# Patient Record
Sex: Female | Born: 2000 | Race: White | Hispanic: Yes | Marital: Single | Smoking: Never smoker
Health system: Southern US, Community
[De-identification: ages and names within clinical notes are randomized; demographics above are authoritative.]

## PROBLEM LIST (undated history)

## (undated) DIAGNOSIS — F4310 Post-traumatic stress disorder, unspecified: Secondary | ICD-10-CM

## (undated) DIAGNOSIS — F319 Bipolar disorder, unspecified: Secondary | ICD-10-CM

## (undated) DIAGNOSIS — F509 Eating disorder, unspecified: Secondary | ICD-10-CM

## (undated) HISTORY — PX: CHOLECYSTECTOMY: SHX55

## (undated) HISTORY — DX: Post-traumatic stress disorder, unspecified: F43.10

## (undated) HISTORY — DX: Bipolar disorder, unspecified: F31.9

## (undated) HISTORY — PX: APPENDECTOMY: SHX54

---
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
## 2020-10-01 DIAGNOSIS — E559 Vitamin D deficiency, unspecified: Secondary | ICD-10-CM | POA: Insufficient documentation

## 2020-11-10 ENCOUNTER — Other Ambulatory Visit: Payer: Self-pay

## 2020-11-10 ENCOUNTER — Ambulatory Visit (HOSPITAL_COMMUNITY)
Admission: EM | Admit: 2020-11-10 | Discharge: 2020-11-10 | Disposition: A | Discharge status: Home / Self Care | Payer: Medicaid Other | Attending: Internal Medicine | Admitting: Internal Medicine

## 2020-11-10 ENCOUNTER — Encounter (HOSPITAL_COMMUNITY): Payer: Self-pay | Admitting: *Deleted

## 2020-11-10 DIAGNOSIS — N390 Urinary tract infection, site not specified: Secondary | ICD-10-CM | POA: Insufficient documentation

## 2020-11-10 LAB — POCT URINALYSIS DIPSTICK, ED / UC
Glucose, UA: 250 mg/dL — AB
Ketones, ur: 15 mg/dL — AB
Nitrite: POSITIVE — AB
Protein, ur: >=300 mg/dL — AB
Specific Gravity, Urine: 1.02 (ref 1.005–1.030)
Urobilinogen, UA: >=8 mg/dL (ref 0.0–1.0)
pH: 5 (ref 5.0–8.0)

## 2020-11-10 LAB — CBG MONITORING, ED: Glucose-Capillary: 69 mg/dL — ABNORMAL LOW (ref 70–99)

## 2020-11-10 LAB — POC URINE PREG, ED: Preg Test, Ur: NEGATIVE

## 2020-11-10 MED ORDER — CEPHALEXIN 500 MG PO CAPS: 500.0000 mg | ORAL_CAPSULE | Freq: Two times a day (BID) | ORAL | 0 refills | Status: DC

## 2020-11-10 NOTE — ED Triage Notes (Signed)
Pt reports frequency ,hematuria for 2 days

## 2020-11-10 NOTE — ED Provider Notes (Signed)
[facility]    CSN: [id number] Arrival date & time: 11/10/20  1240      History   Chief Complaint Chief Complaint  Patient presents with  . Urinary Tract Infection  . Urinary Frequency  . Hematuria    HPI Autumn Rodriguez is a 20 y.o. female.   Here today with 2-day history of dysuria, hematuria, urinary frequency.  Some lower abdominal pressure starting this morning and noticed symptoms seem to be worsening.  Denies fever, chills, nausea, vomiting, diarrhea, vaginal symptoms.  So far taking Azo with minimal temporary relief.  Does have a history of urinary tract infections which have felt similar.     Past Medical History:  Diagnosis Date  . Eating disorder    purging and restricting calories    Patient Active Problem List   Diagnosis Date Noted  . 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 Generalized anxiety disorder 04/09/2014    History reviewed. No pertinent surgical history.  OB History   No obstetric history on file.      Home Medications    Prior to Admission medications   Medication Sig Start Date End Date Taking? Authorizing Provider  cephALEXin (KEFLEX) 500 MG capsule Take 1 capsule (500 mg total) by mouth 2 (two) times daily. 11/10/20  Yes Particia Nearing, PA-C  cetirizine (ZYRTEC) 10 MG tablet Take 1 tablet (10 mg total) by mouth at bedtime. 04/14/14   Withrow, Everardo All, FNP  hydrOXYzine (ATARAX/VISTARIL) 25 MG tablet Take 25 mg by mouth 3 (three) times daily as needed.    [provider]  mometasone (NASONEX) 50 MCG/ACT nasal spray Place 2 sprays into the nose daily. 04/14/14   Withrow, Everardo All, FNP  sodium chloride (OCEAN) 0.65 % SOLN nasal spray Place 1 spray into both nostrils as needed for congestion.    [provider]  vitamin B-6 (PYRIDOXINE) 25 MG tablet Take 1 tablet (25 mg total) by mouth every 8 (eight) hours as needed (for nausea and/or vomiting). 10/23/18    Rennis Harding, PA-C    Family History History reviewed. No pertinent family history.  Social History Social History   Tobacco Use  . Smoking status: Never Smoker  . Smokeless tobacco: Never Used  Substance Use Topics  . Alcohol use: No  . Drug use: No     Allergies   Other  Review of Systems Review of Systems Per HPI  Physical Exam Triage Vital Signs ED Triage Vitals  Enc Vitals Group     BP 11/10/20 1343 115/72     Pulse Rate 11/10/20 1343 100     Resp 11/10/20 1343 16     Temp 11/10/20 1343 98.2 F (36.8 C)     Temp src --      SpO2 11/10/20 1343 98 %     Weight --      Height --      Head Circumference --      Peak Flow --      Pain Score 11/10/20 1341 8     Pain Loc --      Pain Edu? --      Excl. in GC? --    No data found.  Updated Vital Signs BP 115/72   Pulse 100   Temp 98.2 F (36.8 C)   Resp 16   LMP 11/05/2020   SpO2 98%   Visual Acuity Right Eye Distance:   Left Eye Distance:   Bilateral Distance:  Right Eye Near:   Left Eye Near:    Bilateral Near:     Physical Exam Vitals and nursing note reviewed.  Constitutional:      Appearance: Normal appearance. She is not ill-appearing.  HENT:     Head: Atraumatic.  Eyes:     Extraocular Movements: Extraocular movements intact.     Conjunctiva/sclera: Conjunctivae normal.  Cardiovascular:     Rate and Rhythm: Normal rate and regular rhythm.     Heart sounds: Normal heart sounds.  Pulmonary:     Effort: Pulmonary effort is normal.     Breath sounds: Normal breath sounds.  Abdominal:     General: Bowel sounds are normal. There is no distension.     Palpations: Abdomen is soft.     Tenderness: There is no abdominal tenderness. There is no right CVA tenderness, left CVA tenderness or guarding.  Musculoskeletal:        General: Normal range of motion.     Cervical back: Normal range of motion and neck supple.  Skin:    General: Skin is warm and dry.  Neurological:     Mental  Status: She is alert and oriented to person, place, and time.  Psychiatric:        Mood and Affect: Mood normal.        Thought Content: Thought content normal.        Judgment: Judgment normal.    UC Treatments / Results  Labs (all labs ordered are listed, but only abnormal results are displayed) Labs Reviewed  POCT URINALYSIS DIPSTICK, ED / UC - Abnormal; Notable for the following components:      Result Value   Glucose, UA 250 (*)    Bilirubin Urine MODERATE (*)    Ketones, ur 15 (*)    Hgb urine dipstick LARGE (*)    Protein, ur >=300 (*)    Nitrite POSITIVE (*)    Leukocytes,Ua LARGE (*)    All other components within normal limits  CBG MONITORING, ED - Abnormal; Notable for the following components:   Glucose-Capillary 69 (*)    All other components within normal limits  URINE CULTURE  POC URINE PREG, ED    EKG   Radiology No results found.  Procedures Procedures (including critical care time)  Medications Ordered in UC Medications - No data to display  Initial Impression / Assessment and Plan / UC Course  I have reviewed the triage vital signs and the nursing notes.  Pertinent labs & imaging results that were available during my care of the patient were reviewed by me and considered in my medical decision making (see chart for details).     UA indicative of a UTI.  Urine culture pending.  She did recently take Azo which I believed to be the reason why multiple other abnormalities appeared on the UA.  Out of caution, blood glucose was drawn today and was within normal limits.  We will start Keflex, continue Azo as needed.  Push fluids, return for acutely worsening symptoms.  Final Clinical Impressions(s) / UC Diagnoses   Final diagnoses:  Lower urinary tract infectious disease   Discharge Instructions   None    ED Prescriptions    Medication Sig Dispense Auth. Provider   cephALEXin (KEFLEX) 500 MG capsule Take 1 capsule (500 mg total) by mouth 2 (two)  times daily. 14 capsule Particia Nearing, [place]     PDMP not reviewed this encounter.   Jashanti, [place], [place] 11/10/20  1439  

## 2020-11-12 LAB — URINE CULTURE: Culture: 10000 — AB

## 2021-01-07 ENCOUNTER — Other Ambulatory Visit: Payer: Self-pay

## 2021-01-07 ENCOUNTER — Encounter (HOSPITAL_COMMUNITY): Payer: Self-pay | Admitting: Emergency Medicine

## 2021-01-07 ENCOUNTER — Ambulatory Visit (HOSPITAL_COMMUNITY)
Admission: EM | Admit: 2021-01-07 | Discharge: 2021-01-07 | Disposition: A | Discharge status: Home / Self Care | Payer: Medicaid Other

## 2021-01-07 DIAGNOSIS — Z3202 Encounter for pregnancy test, result negative: Secondary | ICD-10-CM | POA: Diagnosis not present

## 2021-01-07 DIAGNOSIS — J039 Acute tonsillitis, unspecified: Secondary | ICD-10-CM

## 2021-01-07 LAB — POC URINE PREG, ED: Preg Test, Ur: NEGATIVE

## 2021-01-07 MED ORDER — AMOXICILLIN 500 MG PO CAPS: 500.0000 mg | ORAL_CAPSULE | Freq: Three times a day (TID) | ORAL | 0 refills | Status: AC

## 2021-01-07 NOTE — ED Triage Notes (Signed)
Patient c/o lymph node swelling, LFT ear pain, and sore throat x 3 days.   Patient endorses a temperature of 99 F at work on Wednesday of this week.   Patient endorses generalized body aches.   Patient reports taking a rapid COVID test at work on Wednesday and reports negative test result.   Patient denies SOB.   Patient has taken Tylenol sinus, throat spray, and OTC ear drops with no relief of symptoms.

## 2021-01-07 NOTE — ED Provider Notes (Signed)
[facility]    CSN: [id number] Arrival date & time: 01/07/21  1725      History   Chief Complaint Chief Complaint  Patient presents with   Lymphadenopathy   Otalgia   Sore Throat    HPI Autumn Rodriguez is a 20 y.o. female.   20 year old female presents to urgent care chief complaint of sore throat, left ear pain for 3 days.  Patient states she has been taken Tylenol sinus medication without relief.  Has had a low-grade temp since Wednesday.  No known illness contacts.  Has remained up-to-date with COVID vaccine and booster.  Patient reports she is on her menses at present but feels like she is pregnant and would like a pregnancy test.  The history is provided by the patient. No language interpreter was used.   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 No pertinent surgical history.  OB History   No obstetric history on file.      Home Medications    Prior to Admission medications   Medication Sig Start Date End Date Taking? Authorizing Provider  amoxicillin (AMOXIL) 500 MG capsule Take 1 capsule (500 mg total) by mouth 3 (three) times daily for 10 days. 01/07/21 01/17/21 Yes Athalee Esterline, Para March, NP  APRI 0.15-30 MG-MCG tablet Take 1 tablet by mouth daily. 10/31/20  Yes [provider]  cetirizine (ZYRTEC) 10 MG tablet Take 1 tablet (10 mg total) by mouth at bedtime. 04/14/14  Yes Withrow, Everardo All, FNP  hydrOXYzine (ATARAX/VISTARIL) 25 MG tablet Take 25 mg by mouth 3 (three) times daily as needed.    [provider]  mometasone (NASONEX) 50 MCG/ACT nasal spray Place 2 sprays into the nose daily. 04/14/14   Withrow, Everardo All, FNP  sodium chloride (OCEAN)  0.65 % SOLN nasal spray Place 1 spray into both nostrils as needed for congestion.    [provider]  vitamin B-6 (PYRIDOXINE) 25 MG tablet Take 1 tablet (25 mg total) by mouth every 8 (eight) hours as needed (for nausea and/or vomiting). 10/23/18   Rennis Harding, PA-C    Family History History reviewed. No pertinent family history.  Social History Social History   Tobacco Use   Smoking status: Never   Smokeless tobacco: Never  Substance Use Topics   Alcohol use: No   Drug use: No     Allergies   Other   Review of Systems Review of Systems  Constitutional:  Positive for fever.  HENT:  Positive for congestion, sore throat, trouble swallowing and voice change.   Respiratory:  Negative for cough.   All other systems reviewed and are negative.   Physical Exam Triage Vital Signs ED Triage Vitals  Enc Vitals Group     BP 01/07/21 1744 114/80     Pulse Rate 01/07/21 1744 (!) 102     Resp 01/07/21 1744 17     Temp 01/07/21 1744 98.8 F (37.1 C)     Temp Source 01/07/21 1744 Oral     SpO2 01/07/21 1744 97 %     Weight --      Height --      Head Circumference --      Peak Flow --  Pain Score 01/07/21 1741 6     Pain Loc --      Pain Edu? --      Excl. in GC? --    No data found.  Updated Vital Signs BP 114/80 (BP Location: Left Arm)   Pulse (!) 102   Temp 98.8 F (37.1 C) (Oral)   Resp 17   LMP 01/03/2021 (Exact Date)   SpO2 97%   Visual Acuity Right Eye Distance:   Left Eye Distance:   Bilateral Distance:    Right Eye Near:   Left Eye Near:    Bilateral Near:     Physical Exam Vitals and nursing note reviewed.  Constitutional:      General: She is not in acute distress.    Appearance: She is well-developed.  HENT:     Head: Normocephalic.     Right Ear: Tympanic membrane is retracted.     Left Ear: Tympanic membrane is retracted.     Nose: Congestion present.     Mouth/Throat:     Lips: Pink.     Mouth: Mucous membranes are  moist.     Pharynx: Pharyngeal swelling, oropharyngeal exudate and posterior oropharyngeal erythema present.     Tonsils: Tonsillar exudate present. No tonsillar abscesses. 1+ on the right. 1+ on the left.  Eyes:     General: Lids are normal.     Conjunctiva/sclera: Conjunctivae normal.     Pupils: Pupils are equal, round, and reactive to light.  Neck:     Trachea: No tracheal deviation.  Cardiovascular:     Rate and Rhythm: Regular rhythm. Tachycardia present.     Pulses: Normal pulses.     Heart sounds: Normal heart sounds. No murmur heard. Pulmonary:     Effort: Pulmonary effort is normal.     Breath sounds: Normal breath sounds.  Abdominal:     General: Bowel sounds are normal.     Palpations: Abdomen is soft.     Tenderness: There is no abdominal tenderness.  Musculoskeletal:        General: Normal range of motion.     Cervical back: Normal range of motion.  Lymphadenopathy:     Cervical: Cervical adenopathy present.     Right cervical: Superficial cervical adenopathy present.     Left cervical: Superficial cervical adenopathy present.  Skin:    General: Skin is warm and dry.     Capillary Refill: Capillary refill takes less than 2 seconds.     Findings: No rash.  Neurological:     Mental Status: She is alert and oriented to person, place, and time.     GCS: GCS eye subscore is 4. GCS verbal subscore is 5. GCS motor subscore is 6.  Psychiatric:        Speech: Speech normal.        Behavior: Behavior normal. Behavior is cooperative.     UC Treatments / Results  Labs (all labs ordered are listed, but only abnormal results are displayed) Labs Reviewed  POC URINE PREG, ED    EKG   Radiology No results found.  Procedures Procedures (including critical care time)  Medications Ordered in UC Medications - No data to display  Initial Impression / Assessment and Plan / UC Course  I have reviewed the triage vital signs and the nursing notes.  Pertinent labs &  imaging results that were available during my care of the patient were reviewed by me and considered in my medical decision making (see chart  for details).     Ddx: Exudative tonsillitis, viral pharyngitis, mono Final Clinical Impressions(s) / UC Diagnoses   Final diagnoses:  Exudative tonsillitis     Discharge Instructions      Rest, push fluids, take antibiotic as directed.  Use backup birth control method while on antibiotics.  May alternate Tylenol ibuprofen as labeled directed for weight-based dose also May use Chloraseptic throat lozenges while awake to help with throat pain, warm salt water gargles may help as well.  Your pregnancy test was negative today.      ED Prescriptions     Medication Sig Dispense Auth. Provider   amoxicillin (AMOXIL) 500 MG capsule Take 1 capsule (500 mg total) by mouth 3 (three) times daily for 10 days. 30 capsule Arwen Haseley, Para March, NP      PDMP not reviewed this encounter.   Clancy Gourd, NP 01/07/21 1920

## 2021-01-07 NOTE — Discharge Instructions (Addendum)
Rest, push fluids, take antibiotic as directed.  Use backup birth control method while on antibiotics.  May alternate Tylenol ibuprofen as labeled directed for weight-based dose also May use Chloraseptic throat lozenges while awake to help with throat pain, warm salt water gargles may help as well.  Your pregnancy test was negative today.

## 2021-02-09 DIAGNOSIS — R0683 Snoring: Secondary | ICD-10-CM | POA: Insufficient documentation

## 2021-02-09 DIAGNOSIS — J353 Hypertrophy of tonsils with hypertrophy of adenoids: Secondary | ICD-10-CM | POA: Insufficient documentation

## 2021-02-09 DIAGNOSIS — J302 Other seasonal allergic rhinitis: Secondary | ICD-10-CM | POA: Insufficient documentation

## 2022-02-14 DIAGNOSIS — K3580 Unspecified acute appendicitis: Secondary | ICD-10-CM | POA: Insufficient documentation

## 2022-02-15 DIAGNOSIS — K352 Acute appendicitis with generalized peritonitis, without abscess: Secondary | ICD-10-CM | POA: Insufficient documentation

## 2022-03-02 ENCOUNTER — Ambulatory Visit
Admission: EM | Admit: 2022-03-02 | Discharge: 2022-03-02 | Disposition: A | Discharge status: Home / Self Care | Payer: Medicaid Other | Attending: Emergency Medicine | Admitting: Emergency Medicine

## 2022-03-02 DIAGNOSIS — R300 Dysuria: Secondary | ICD-10-CM | POA: Diagnosis not present

## 2022-03-02 LAB — POCT URINALYSIS DIP (MANUAL ENTRY)
Bilirubin, UA: NEGATIVE
Glucose, UA: NEGATIVE mg/dL
Ketones, POC UA: NEGATIVE mg/dL
Nitrite, UA: NEGATIVE
Protein Ur, POC: 30 mg/dL — AB
Spec Grav, UA: 1.02 (ref 1.010–1.025)
Urobilinogen, UA: 0.2 E.U./dL
pH, UA: 7.5 (ref 5.0–8.0)

## 2022-03-02 LAB — POCT URINE PREGNANCY: Preg Test, Ur: NEGATIVE

## 2022-03-02 MED ORDER — CEPHALEXIN 500 MG PO CAPS: 500.0000 mg | ORAL_CAPSULE | Freq: Two times a day (BID) | ORAL | 0 refills | Status: AC

## 2022-03-02 NOTE — Discharge Instructions (Addendum)
Take the antibiotic as directed.  The urine culture is pending.  We will call you if it shows the need to change or discontinue your antibiotic.    Follow up with your primary care provider if your symptoms are not improving.    
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
## 2022-03-02 NOTE — ED Triage Notes (Signed)
Patient presents to UC for dysuria, hematuria and blood clots in urine since Sunday. Not on any medications to treat UTI.

## 2022-03-03 LAB — URINE CULTURE: Culture: NO GROWTH

## 2022-04-25 DIAGNOSIS — R5383 Other fatigue: Secondary | ICD-10-CM | POA: Diagnosis not present

## 2022-04-25 DIAGNOSIS — E559 Vitamin D deficiency, unspecified: Secondary | ICD-10-CM | POA: Diagnosis not present

## 2022-04-25 DIAGNOSIS — R42 Dizziness and giddiness: Secondary | ICD-10-CM | POA: Diagnosis not present

## 2022-04-25 DIAGNOSIS — G478 Other sleep disorders: Secondary | ICD-10-CM | POA: Diagnosis not present

## 2022-06-01 DIAGNOSIS — R040 Epistaxis: Secondary | ICD-10-CM | POA: Diagnosis not present

## 2022-06-01 DIAGNOSIS — G4700 Insomnia, unspecified: Secondary | ICD-10-CM | POA: Diagnosis not present

## 2022-06-01 DIAGNOSIS — J069 Acute upper respiratory infection, unspecified: Secondary | ICD-10-CM | POA: Diagnosis not present

## 2022-06-01 DIAGNOSIS — F332 Major depressive disorder, recurrent severe without psychotic features: Secondary | ICD-10-CM | POA: Diagnosis not present

## 2022-06-01 DIAGNOSIS — F411 Generalized anxiety disorder: Secondary | ICD-10-CM | POA: Diagnosis not present

## 2022-07-06 DIAGNOSIS — G4700 Insomnia, unspecified: Secondary | ICD-10-CM | POA: Diagnosis not present

## 2022-07-06 DIAGNOSIS — F411 Generalized anxiety disorder: Secondary | ICD-10-CM | POA: Diagnosis not present

## 2022-07-06 DIAGNOSIS — F332 Major depressive disorder, recurrent severe without psychotic features: Secondary | ICD-10-CM | POA: Diagnosis not present

## 2022-07-06 DIAGNOSIS — R5382 Chronic fatigue, unspecified: Secondary | ICD-10-CM | POA: Diagnosis not present

## 2022-07-06 DIAGNOSIS — R790 Abnormal level of blood mineral: Secondary | ICD-10-CM | POA: Diagnosis not present

## 2022-07-17 ENCOUNTER — Ambulatory Visit: Payer: Medicaid Other | Admitting: Psychiatry

## 2022-08-04 DIAGNOSIS — R5382 Chronic fatigue, unspecified: Secondary | ICD-10-CM | POA: Diagnosis not present

## 2022-08-04 DIAGNOSIS — Z3041 Encounter for surveillance of contraceptive pills: Secondary | ICD-10-CM | POA: Diagnosis not present

## 2022-08-04 DIAGNOSIS — G4700 Insomnia, unspecified: Secondary | ICD-10-CM | POA: Diagnosis not present

## 2022-08-04 DIAGNOSIS — F411 Generalized anxiety disorder: Secondary | ICD-10-CM | POA: Diagnosis not present

## 2022-08-04 DIAGNOSIS — N926 Irregular menstruation, unspecified: Secondary | ICD-10-CM | POA: Diagnosis not present

## 2022-08-04 DIAGNOSIS — F332 Major depressive disorder, recurrent severe without psychotic features: Secondary | ICD-10-CM | POA: Diagnosis not present

## 2022-08-11 DIAGNOSIS — K8010 Calculus of gallbladder with chronic cholecystitis without obstruction: Secondary | ICD-10-CM | POA: Diagnosis not present

## 2022-08-11 DIAGNOSIS — K8020 Calculus of gallbladder without cholecystitis without obstruction: Secondary | ICD-10-CM | POA: Diagnosis not present

## 2022-08-11 DIAGNOSIS — K810 Acute cholecystitis: Secondary | ICD-10-CM | POA: Diagnosis not present

## 2022-08-11 DIAGNOSIS — K819 Cholecystitis, unspecified: Secondary | ICD-10-CM | POA: Diagnosis not present

## 2022-08-14 DIAGNOSIS — F411 Generalized anxiety disorder: Secondary | ICD-10-CM | POA: Diagnosis not present

## 2022-08-14 DIAGNOSIS — K8040 Calculus of bile duct with cholecystitis, unspecified, without obstruction: Secondary | ICD-10-CM | POA: Diagnosis not present

## 2022-08-14 DIAGNOSIS — R109 Unspecified abdominal pain: Secondary | ICD-10-CM | POA: Diagnosis not present

## 2022-08-14 DIAGNOSIS — E876 Hypokalemia: Secondary | ICD-10-CM | POA: Diagnosis not present

## 2022-08-14 DIAGNOSIS — G4700 Insomnia, unspecified: Secondary | ICD-10-CM | POA: Diagnosis not present

## 2022-08-14 DIAGNOSIS — R112 Nausea with vomiting, unspecified: Secondary | ICD-10-CM | POA: Diagnosis not present

## 2022-08-14 DIAGNOSIS — Z9049 Acquired absence of other specified parts of digestive tract: Secondary | ICD-10-CM | POA: Diagnosis not present

## 2022-08-14 DIAGNOSIS — Z79899 Other long term (current) drug therapy: Secondary | ICD-10-CM | POA: Diagnosis not present

## 2022-08-15 DIAGNOSIS — F411 Generalized anxiety disorder: Secondary | ICD-10-CM | POA: Diagnosis not present

## 2022-08-15 DIAGNOSIS — K6389 Other specified diseases of intestine: Secondary | ICD-10-CM | POA: Diagnosis not present

## 2022-08-15 DIAGNOSIS — F332 Major depressive disorder, recurrent severe without psychotic features: Secondary | ICD-10-CM | POA: Diagnosis not present

## 2022-08-15 DIAGNOSIS — K3189 Other diseases of stomach and duodenum: Secondary | ICD-10-CM | POA: Diagnosis not present

## 2022-08-15 DIAGNOSIS — Z9049 Acquired absence of other specified parts of digestive tract: Secondary | ICD-10-CM | POA: Diagnosis not present

## 2022-08-16 DIAGNOSIS — F332 Major depressive disorder, recurrent severe without psychotic features: Secondary | ICD-10-CM | POA: Diagnosis not present

## 2022-08-16 DIAGNOSIS — F411 Generalized anxiety disorder: Secondary | ICD-10-CM | POA: Diagnosis not present

## 2022-08-16 DIAGNOSIS — G4700 Insomnia, unspecified: Secondary | ICD-10-CM | POA: Diagnosis not present

## 2022-08-17 DIAGNOSIS — G4700 Insomnia, unspecified: Secondary | ICD-10-CM | POA: Diagnosis not present

## 2022-08-17 DIAGNOSIS — F411 Generalized anxiety disorder: Secondary | ICD-10-CM | POA: Diagnosis not present

## 2022-08-17 DIAGNOSIS — F332 Major depressive disorder, recurrent severe without psychotic features: Secondary | ICD-10-CM | POA: Diagnosis not present

## 2022-08-18 DIAGNOSIS — E559 Vitamin D deficiency, unspecified: Secondary | ICD-10-CM | POA: Diagnosis not present

## 2022-08-18 DIAGNOSIS — G4700 Insomnia, unspecified: Secondary | ICD-10-CM | POA: Diagnosis not present

## 2022-08-18 DIAGNOSIS — F332 Major depressive disorder, recurrent severe without psychotic features: Secondary | ICD-10-CM | POA: Diagnosis not present

## 2022-08-18 DIAGNOSIS — F411 Generalized anxiety disorder: Secondary | ICD-10-CM | POA: Diagnosis not present

## 2022-08-21 DIAGNOSIS — F319 Bipolar disorder, unspecified: Secondary | ICD-10-CM | POA: Diagnosis not present

## 2022-08-21 DIAGNOSIS — F411 Generalized anxiety disorder: Secondary | ICD-10-CM | POA: Diagnosis not present

## 2022-08-22 DIAGNOSIS — F319 Bipolar disorder, unspecified: Secondary | ICD-10-CM | POA: Diagnosis not present

## 2022-08-22 DIAGNOSIS — G4700 Insomnia, unspecified: Secondary | ICD-10-CM | POA: Diagnosis not present

## 2022-08-22 DIAGNOSIS — F411 Generalized anxiety disorder: Secondary | ICD-10-CM | POA: Diagnosis not present

## 2022-08-22 DIAGNOSIS — Z9049 Acquired absence of other specified parts of digestive tract: Secondary | ICD-10-CM | POA: Diagnosis not present

## 2022-08-22 DIAGNOSIS — F314 Bipolar disorder, current episode depressed, severe, without psychotic features: Secondary | ICD-10-CM | POA: Diagnosis not present

## 2022-08-23 DIAGNOSIS — F411 Generalized anxiety disorder: Secondary | ICD-10-CM | POA: Diagnosis not present

## 2022-08-23 DIAGNOSIS — F332 Major depressive disorder, recurrent severe without psychotic features: Secondary | ICD-10-CM | POA: Diagnosis not present

## 2022-08-23 DIAGNOSIS — F319 Bipolar disorder, unspecified: Secondary | ICD-10-CM | POA: Diagnosis not present

## 2022-08-24 DIAGNOSIS — F411 Generalized anxiety disorder: Secondary | ICD-10-CM | POA: Diagnosis not present

## 2022-08-24 DIAGNOSIS — F319 Bipolar disorder, unspecified: Secondary | ICD-10-CM | POA: Diagnosis not present

## 2022-08-25 DIAGNOSIS — F411 Generalized anxiety disorder: Secondary | ICD-10-CM | POA: Diagnosis not present

## 2022-08-25 DIAGNOSIS — F319 Bipolar disorder, unspecified: Secondary | ICD-10-CM | POA: Diagnosis not present

## 2022-08-25 DIAGNOSIS — F332 Major depressive disorder, recurrent severe without psychotic features: Secondary | ICD-10-CM | POA: Diagnosis not present

## 2022-08-30 DIAGNOSIS — Z9049 Acquired absence of other specified parts of digestive tract: Secondary | ICD-10-CM | POA: Diagnosis not present

## 2022-08-30 DIAGNOSIS — F314 Bipolar disorder, current episode depressed, severe, without psychotic features: Secondary | ICD-10-CM | POA: Diagnosis not present

## 2022-09-01 DIAGNOSIS — F319 Bipolar disorder, unspecified: Secondary | ICD-10-CM | POA: Diagnosis not present

## 2022-09-19 DIAGNOSIS — R634 Abnormal weight loss: Secondary | ICD-10-CM | POA: Diagnosis not present

## 2022-09-19 DIAGNOSIS — R197 Diarrhea, unspecified: Secondary | ICD-10-CM | POA: Diagnosis not present

## 2022-09-19 DIAGNOSIS — R112 Nausea with vomiting, unspecified: Secondary | ICD-10-CM | POA: Diagnosis not present

## 2022-09-26 ENCOUNTER — Encounter (HOSPITAL_COMMUNITY): Payer: Self-pay

## 2022-09-26 ENCOUNTER — Ambulatory Visit (INDEPENDENT_AMBULATORY_CARE_PROVIDER_SITE_OTHER): Payer: Medicaid Other | Admitting: Licensed Clinical Social Worker

## 2022-09-26 DIAGNOSIS — F4310 Post-traumatic stress disorder, unspecified: Secondary | ICD-10-CM

## 2022-09-26 DIAGNOSIS — F3130 Bipolar disorder, current episode depressed, mild or moderate severity, unspecified: Secondary | ICD-10-CM
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
## 2022-10-01 DIAGNOSIS — J029 Acute pharyngitis, unspecified: Secondary | ICD-10-CM | POA: Diagnosis not present

## 2022-10-03 DIAGNOSIS — F319 Bipolar disorder, unspecified: Secondary | ICD-10-CM | POA: Diagnosis not present

## 2022-10-09 DIAGNOSIS — K3189 Other diseases of stomach and duodenum: Secondary | ICD-10-CM | POA: Diagnosis not present

## 2022-10-09 DIAGNOSIS — R112 Nausea with vomiting, unspecified: Secondary | ICD-10-CM | POA: Diagnosis not present

## 2022-10-24 ENCOUNTER — Ambulatory Visit (INDEPENDENT_AMBULATORY_CARE_PROVIDER_SITE_OTHER): Payer: Medicaid Other | Admitting: Licensed Clinical Social Worker

## 2022-10-24 DIAGNOSIS — F411 Generalized anxiety disorder: Secondary | ICD-10-CM

## 2022-10-24 DIAGNOSIS — F319 Bipolar disorder, unspecified: Secondary | ICD-10-CM | POA: Diagnosis not present

## 2022-10-24 DIAGNOSIS — F3130 Bipolar disorder, current episode depressed, mild or moderate severity, unspecified: Secondary | ICD-10-CM
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
## 2022-11-08 DIAGNOSIS — F319 Bipolar disorder, unspecified: Secondary | ICD-10-CM | POA: Diagnosis not present

## 2022-11-15 ENCOUNTER — Ambulatory Visit (INDEPENDENT_AMBULATORY_CARE_PROVIDER_SITE_OTHER): Payer: Medicaid Other | Admitting: Licensed Clinical Social Worker

## 2022-11-15 DIAGNOSIS — F3130 Bipolar disorder, current episode depressed, mild or moderate severity, unspecified: Secondary | ICD-10-CM | POA: Diagnosis not present

## 2022-11-15 NOTE — Progress Notes (Signed)
THERAPIST PROGRESS NOTE  Session Time: 30   Participation Level: Active  Behavioral Response: CasualAlertAnxious and Depressed  Type of Therapy: Individual Therapy  Treatment Goals addressed:  Active     Anxiety     LTG: Autumn Rodriguez will score less than 5 on the Generalized Anxiety Disorder 7 Scale (GAD-7)      Start:  09/26/22    Expected End:  03/19/23         STG: Autumn Rodriguez will participate in at least 80% of scheduled individual psychotherapy sessions  (Progressing)     Start:  09/26/22    Expected End:  03/19/23         identify three trigger for anxiety  (Progressing)     Start:  09/26/22    Expected End:  03/19/23         identify three coping skills for anxiety  (Progressing)     Start:  09/26/22    Expected End:  03/19/23       Goal Note     Gratitude exercises            BH CCP BIPOLAR DISORDER-MANIA/HYPOMANIA     three consequence of not taking medication  (Progressing)     Start:  09/26/22    Expected End:  03/19/23         Pt to self report no SI 7/7 days per week  (Progressing)     Start:  09/26/22    Expected End:  03/19/23            ProgressTowards Goals: Progressing  Interventions: CBT and Motivational Interviewing  Summary: Autumn Rodriguez is a 22 y.o. female who presents with depressed and anxious mood\affect.  Patient was pleasant, cooperative, maintained good eye contact.  She engaged well in therapy session and was dressed casually.   Patient in session today admitted to excessive spending, euphoria, kleptomania, and hypersexual activity for several weeks.  Patient reports she is now in a depressive episode reporting worthlessness, hopelessness, fatigue, and insomnia.  Patient reports wanting to switch to [facility] for medication management due to wanting sessions to be in person for psychiatry.  Patient reports this is the first time that she has admitted to her excessive spending.  Patient  reports spending in excess of $10,000 on various things from her house to recreational activities.  Patient reports utilizing marijuana daily.   Suicidal/Homicidal: Nowithout intent/plan  Therapist Response:    Interventions/Plan: LCSW solution-focused therapy to schedule appointment with [facility] for medication management.  Patient reports already seeing a provider but wants to switch to [facility] due to wanting in person appointments.  Patient reports taking Abilify and Prozac.  LCSW educated patient on being honest with psychiatry in order to get her on the right dosage and right sets of medications.  LCSW notes euphoria symptoms as stated above as well as depressive symptoms.  LCSW educated patient on taking medication 7 out of 7 days/week.  Patient reports no SI or HI.    Plan: Return again in 4 weeks.  Diagnosis: Bipolar I disorder, most recent episode depressed (HCC)  Collaboration of Care: Other Referral to [facility] for medication mgnt.   Patient/Guardian was advised Release of Information must be obtained prior to any record release in order to collaborate their care with an outside provider. Patient/Guardian was advised if they have not already done so to contact the registration department to sign all necessary  forms in order for [place] to release information regarding their care.   Consent: Patient/Guardian gives verbal consent for treatment and assignment of benefits for services provided during this visit. Patient/Guardian expressed understanding and agreed to proceed.   Weber Cooks, LCSW 11/15/2022

## 2022-11-29 ENCOUNTER — Ambulatory Visit (INDEPENDENT_AMBULATORY_CARE_PROVIDER_SITE_OTHER): Payer: Medicaid Other | Admitting: Licensed Clinical Social Worker

## 2022-11-29 DIAGNOSIS — F3130 Bipolar disorder, current episode depressed, mild or moderate severity, unspecified: Secondary | ICD-10-CM

## 2022-11-29 DIAGNOSIS — F4310 Post-traumatic stress disorder, unspecified: Secondary | ICD-10-CM

## 2022-11-29 NOTE — Progress Notes (Signed)
THERAPIST PROGRESS NOTE  Session Time: 30  Participation Level: Active  Behavioral Response: CasualAlertAnxious and Depressed  Type of Therapy: Individual Therapy  Treatment Goals addressed:  Active     Anxiety     LTG: Isobelle will score less than 5 on the Generalized Anxiety Disorder 7 Scale (GAD-7)  (Progressing)     Start:  09/26/22    Expected End:  03/19/23       Goal Note     Original score 21 scoring 18 today          STG: Porshe will participate in at least 80% of scheduled individual psychotherapy sessions  (Progressing)     Start:  09/26/22    Expected End:  03/19/23         identify three trigger for anxiety  (Progressing)     Start:  09/26/22    Expected End:  03/19/23       Goal Note              identify three coping skills for anxiety  (Progressing)     Start:  09/26/22    Expected End:  03/19/23       Goal Note     Gratitude exercises            BH CCP BIPOLAR DISORDER-MANIA/HYPOMANIA     three consequence of not taking medication  (Progressing)     Start:  09/26/22    Expected End:  03/19/23       Goal Note     Excessive spending          Pt to self report no SI 7/7 days per week  (Progressing)     Start:  09/26/22    Expected End:  03/19/23       Goal Note     Pt reports no SI today             ProgressTowards Goals: Progressing  Interventions: CBT, Motivational Interviewing, and Supportive  Summary: FLOWER FRANKO is a 22 y.o. female who presents with flat and depressed mood\affect.  Patient was pleasant, cooperative, maintained good eye contact.  She engaged well in therapy session was dressed casually.  Tenecia was alert and oriented x 5.  Patient reports today that her primary stressors are financials and work.  Patient reports that she has been working extra shifts to catch up on her finances.  Which resulted in her having to work all her shifts.  Patient reports that she has a $10,000 medical  bill from a notice from which was not medically necessary but patient wanted due to not liking her nose.  Patient reports she has not satisfied with her nose that she is going to let them send credit card debt to collections because of the nose job.  Patient reports it has helped moving in with her boyfriend to help pay for half the bills.  Patient reports coping to utilizing marijuana daily.  Suicidal/Homicidal: Nowithout intent/plan  Therapist Response:     Interventions/Plan: LCSW educated patient on signs and symptoms of bipolar depression versus bipolar manic episodes.  LCSW educated patient on the importance of following up with medication management team and taking medications consistently.  LCSW educated patient on coping skills for anxiety such as progressive muscle relaxation.  LCSW spoke with patient about grounding techniques for" 5, 4, 3, 2, 1 grounding technique".  LCSW administered the GAD-7.  LCSW administered the PHQ-9.  LCSW notes a decrease in both PHQ-9 and  GAD-7.  LCSW reviewed scores with patient.  Plan: Return again in 4 weeks.  Diagnosis: Bipolar I disorder, most recent episode depressed (HCC)  PTSD (post-traumatic stress disorder)  Collaboration of Care: Other Referral to medication mgnt team   Patient/Guardian was advised Release of Information must be obtained prior to any record release in order to collaborate their care with an outside provider. Patient/Guardian was advised if they have not already done so to contact the registration department to sign all necessary forms in order for [place] to release information regarding their care.   Consent: Patient/Guardian gives verbal consent for treatment and assignment of benefits for services provided during this visit. Patient/Guardian expressed understanding and agreed to proceed.   Weber Cooks, LCSW 11/29/2022
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
## 2022-12-12 ENCOUNTER — Ambulatory Visit (INDEPENDENT_AMBULATORY_CARE_PROVIDER_SITE_OTHER): Payer: Medicaid Other | Admitting: Psychiatry

## 2022-12-12 ENCOUNTER — Encounter (HOSPITAL_COMMUNITY): Payer: Self-pay | Admitting: Psychiatry

## 2022-12-12 VITALS — BP 103/58 | HR 83 | Ht 62.6 in | Wt 126.0 lb

## 2022-12-12 DIAGNOSIS — F4310 Post-traumatic stress disorder, unspecified: Secondary | ICD-10-CM | POA: Diagnosis not present

## 2022-12-12 DIAGNOSIS — F39 Unspecified mood [affective] disorder: Secondary | ICD-10-CM

## 2022-12-12 DIAGNOSIS — F411 Generalized anxiety disorder: Secondary | ICD-10-CM

## 2022-12-12 MED ORDER — HYDROXYZINE HCL 25 MG PO TABS: 25.0000 mg | ORAL_TABLET | Freq: Every evening | ORAL | 1 refills | Status: AC | PRN

## 2022-12-12 MED ORDER — FLUOXETINE HCL 20 MG PO CAPS: 20.0000 mg | ORAL_CAPSULE | Freq: Every day | ORAL | 2 refills | Status: DC

## 2022-12-12 MED ORDER — LAMOTRIGINE 25 MG PO TABS: ORAL_TABLET | ORAL | 2 refills | Status: DC

## 2022-12-12 NOTE — Patient Instructions (Signed)
Thank you for attending your appointment today.  -- START lamotrigine 25 mg daily for 2 weeks then INCREASE to 50 mg daily thereafter -- Continue Prozac 20 mg daily.  Please do not make any changes to medications without first discussing with your provider. If you are experiencing a psychiatric emergency, please call 911 or present to your nearest emergency department. Additional crisis, medication management, and therapy resources are included below.  [facility]  [address], [place], [place] [address] [phone] WALK-IN URGENT CARE 24/7 FOR ANYONE [address] [place], [place]  [phone] Fax: [phone] [email or web address] *Interpreters available *Accepts all insurance and uninsured for Urgent Care needs *Accepts Medicaid and uninsured for outpatient treatment (below)      ONLY FOR [facility]  Below:    Outpatient New Patient Assessment/Therapy Walk-ins:        Monday -Thursday 8am until slots are full.        Every Friday 1pm-4pm  (first come, first served)                   New Patient Psychiatry/Medication Management        Monday-Friday 8am-11am (first come, first served)               For all walk-ins we ask that you arrive by 7:15am, because patients will be seen in the order of arrival.

## 2022-12-26 ENCOUNTER — Encounter (HOSPITAL_COMMUNITY): Payer: Self-pay

## 2022-12-26 ENCOUNTER — Ambulatory Visit (HOSPITAL_COMMUNITY): Payer: Medicaid Other | Admitting: Licensed Clinical Social Worker

## 2022-12-29 ENCOUNTER — Other Ambulatory Visit: Payer: Self-pay

## 2022-12-29 ENCOUNTER — Encounter (HOSPITAL_COMMUNITY): Payer: Self-pay

## 2022-12-29 ENCOUNTER — Emergency Department (HOSPITAL_COMMUNITY)
Admission: EM | Admit: 2022-12-29 | Discharge: 2022-12-29 | Disposition: A | Discharge status: Home / Self Care | Payer: Medicaid Other | Source: Home / Self Care | Attending: Emergency Medicine | Admitting: Emergency Medicine

## 2022-12-29 DIAGNOSIS — W57XXXA Bitten or stung by nonvenomous insect and other nonvenomous arthropods, initial encounter: Secondary | ICD-10-CM | POA: Diagnosis not present

## 2022-12-29 DIAGNOSIS — S80861A Insect bite (nonvenomous), right lower leg, initial encounter: Secondary | ICD-10-CM | POA: Insufficient documentation

## 2022-12-29 DIAGNOSIS — S80862A Insect bite (nonvenomous), left lower leg, initial encounter: Secondary | ICD-10-CM | POA: Insufficient documentation

## 2022-12-29 DIAGNOSIS — L03115 Cellulitis of right lower limb: Secondary | ICD-10-CM | POA: Diagnosis not present

## 2022-12-29 DIAGNOSIS — L03116 Cellulitis of left lower limb: Secondary | ICD-10-CM | POA: Insufficient documentation

## 2022-12-29 DIAGNOSIS — S80869A Insect bite (nonvenomous), unspecified lower leg, initial encounter: Secondary | ICD-10-CM

## 2022-12-29 DIAGNOSIS — L0390 Cellulitis, unspecified: Secondary | ICD-10-CM

## 2022-12-29 LAB — COMPREHENSIVE METABOLIC PANEL
ALT: 14 U/L (ref 0–44)
AST: 14 U/L — ABNORMAL LOW (ref 15–41)
Albumin: 3.6 g/dL (ref 3.5–5.0)
Alkaline Phosphatase: 56 U/L (ref 38–126)
Anion gap: 4 — ABNORMAL LOW (ref 5–15)
BUN: 13 mg/dL (ref 6–20)
CO2: 24 mmol/L (ref 22–32)
Calcium: 8.1 mg/dL — ABNORMAL LOW (ref 8.9–10.3)
Chloride: 106 mmol/L (ref 98–111)
Creatinine, Ser: 0.68 mg/dL (ref 0.44–1.00)
GFR, Estimated: >60 mL/min (ref >60)
Glucose, Bld: 106 mg/dL — ABNORMAL HIGH (ref 70–99)
Potassium: 3.6 mmol/L (ref 3.5–5.1)
Sodium: 134 mmol/L — ABNORMAL LOW (ref 135–145)
Total Bilirubin: 0.2 mg/dL — ABNORMAL LOW (ref 0.3–1.2)
Total Protein: 7.7 g/dL (ref 6.5–8.1)

## 2022-12-29 LAB — CBC WITH DIFFERENTIAL/PLATELET
Abs Immature Granulocytes: 0.02 10*3/uL (ref 0.00–0.07)
Basophils Absolute: 0.1 10*3/uL (ref 0.0–0.1)
Basophils Relative: 1 %
Eosinophils Absolute: 0.3 10*3/uL (ref 0.0–0.5)
Eosinophils Relative: 4 %
HCT: 36 % (ref 36.0–46.0)
Hemoglobin: 11.6 g/dL — ABNORMAL LOW (ref 12.0–15.0)
Immature Granulocytes: 0 %
Lymphocytes Relative: 27 %
Lymphs Abs: 2.4 10*3/uL (ref 0.7–4.0)
MCH: 29.4 pg (ref 26.0–34.0)
MCHC: 32.2 g/dL (ref 30.0–36.0)
MCV: 91.1 fL (ref 80.0–100.0)
Monocytes Absolute: 0.9 10*3/uL (ref 0.1–1.0)
Monocytes Relative: 10 %
Neutro Abs: 5.1 10*3/uL (ref 1.7–7.7)
Neutrophils Relative %: 58 %
Platelets: 344 10*3/uL (ref 150–400)
RBC: 3.95 MIL/uL (ref 3.87–5.11)
RDW: 12.6 % (ref 11.5–15.5)
WBC: 8.9 10*3/uL (ref 4.0–10.5)
nRBC: 0 % (ref 0.0–0.2)

## 2022-12-29 LAB — RAPID URINE DRUG SCREEN, HOSP PERFORMED
Amphetamines: NOT DETECTED
Barbiturates: NOT DETECTED
Benzodiazepines: NOT DETECTED
Cocaine: NOT DETECTED
Opiates: NOT DETECTED
Tetrahydrocannabinol: POSITIVE — AB

## 2022-12-29 MED ORDER — KETOROLAC TROMETHAMINE 60 MG/2ML IM SOLN: 30.0000 mg | Freq: Once | INTRAMUSCULAR | Status: DC

## 2022-12-29 MED ORDER — KETOROLAC TROMETHAMINE 15 MG/ML IJ SOLN
15.0000 mg | Freq: Once | INTRAMUSCULAR | Status: AC
  Administered 2022-12-29: 15 mg via INTRAVENOUS
  Filled 2022-12-29: qty 1

## 2022-12-29 MED ORDER — DIPHENHYDRAMINE HCL 25 MG PO CAPS
25.0000 mg | ORAL_CAPSULE | Freq: Once | ORAL | Status: AC
  Administered 2022-12-29: 25 mg via ORAL
  Filled 2022-12-29: qty 1

## 2022-12-29 MED ORDER — DOXYCYCLINE HYCLATE 100 MG PO CAPS: 100.0000 mg | ORAL_CAPSULE | Freq: Two times a day (BID) | ORAL | 0 refills | Status: AC

## 2022-12-29 NOTE — Discharge Instructions (Addendum)
Symptoms appear to be insect bites with surrounding developing cellulitis which we will treat with a course of antibiotics.  Take Benadryl for the itching.  Follow-up outpatient with a primary care provider for a repeat assessment.

## 2022-12-29 NOTE — ED Provider Notes (Signed)
[place] EMERGENCY DEPARTMENT AT [facility] Provider Note   CSN: [id number] Arrival date & time: 12/29/22  0003     History  Chief Complaint  Patient presents with   Insect Bite    Autumn Rodriguez is a 22 y.o. female.  HPI   22 year old female presenting to the emergency department with multiple bites to her lower extremities.  The patient does not recall being bit.  She did tell nursing staff that a cat was recently diagnosed with fleas.  She states that she has had surrounding redness and pain especially about her knees in association with the bites with associated pain.  She endorses warmth.  She endorses itching associated with the bites.  No fevers or chills.  Home Medications Prior to Admission medications   Medication Sig Start Date End Date Taking? Authorizing Provider  doxycycline (VIBRAMYCIN) 100 MG capsule Take 1 capsule (100 mg total) by mouth 2 (two) times daily for 5 days. 12/29/22 01/03/23 Yes Ernie Avena, MD  APRI 0.15-30 MG-MCG tablet Take 1 tablet by mouth daily. 10/31/20   [provider]  FLUoxetine (PROZAC) 20 MG capsule Take 1 capsule (20 mg total) by mouth daily. 12/12/22 03/12/23  Bahraini, Sarah A  hydrOXYzine (ATARAX) 25 MG tablet Take 1-2 tablets (25-50 mg total) by mouth at bedtime as needed (sleep). 12/12/22   B[place]raini, Sarah A  lamoTRIgine (LAMICTAL) 25 MG tablet Take 1 tablet (25 mg total) daily for 2 weeks then increase to 2 tablets (50 mg total) daily 12/12/22   B[place]raini, Sarah A  Vitamin D, Ergocalciferol, (DRISDOL) 1.25 MG (50000 UNIT) CAPS capsule Take 50,000 Units by mouth every 7 (seven) days.    [provider]      Allergies    Morphine and Other    Review of Systems   Review of Systems  All other systems reviewed and are negative.   Physical Exam Updated Vital Signs BP 106/61   Pulse 72   Temp 98.4 F (36.9 C)   Resp 14   Ht 5\' 2"  (1.575 m)   Wt 58.1 kg   SpO2 100%   BMI 23.41 kg/m  Physical  Exam Vitals and nursing note reviewed.  Constitutional:      General: She is not in acute distress.    Appearance: She is well-developed.  HENT:     Head: Normocephalic and atraumatic.  Eyes:     Conjunctiva/sclera: Conjunctivae normal.  Cardiovascular:     Rate and Rhythm: Normal rate and regular rhythm.     Heart sounds: No murmur heard. Pulmonary:     Effort: Pulmonary effort is normal. No respiratory distress.     Breath sounds: Normal breath sounds.  Abdominal:     Palpations: Abdomen is soft.     Tenderness: There is no abdominal tenderness.  Musculoskeletal:        General: No swelling.     Cervical back: Neck supple.     Comments: Scattered insect bites noted along the bilateral lower extremities, 2 areas about the knees appear to be with surrounding erythema, warmth, mild tenderness concerning for cellulitis  Skin:    General: Skin is warm and dry.     Capillary Refill: Capillary refill takes less than 2 seconds.  Neurological:     Mental Status: She is alert.  Psychiatric:        Mood and Affect: Mood normal.     ED Results / Procedures / Treatments   Labs (all labs ordered are  listed, but only abnormal results are displayed) Labs Reviewed  CBC WITH DIFFERENTIAL/PLATELET - Abnormal; Notable for the following components:      Result Value   Hemoglobin 11.6 (*)    All other components within normal limits  COMPREHENSIVE METABOLIC PANEL - Abnormal; Notable for the following components:   Sodium 134 (*)    Glucose, Bld 106 (*)    Calcium 8.1 (*)    AST 14 (*)    Total Bilirubin 0.2 (*)    Anion gap 4 (*)    All other components within normal limits  RAPID URINE DRUG SCREEN, HOSP PERFORMED - Abnormal; Notable for the following components:   Tetrahydrocannabinol POSITIVE (*)    All other components within normal limits    EKG None  Radiology No results found.  Procedures Procedures    Medications Ordered in ED Medications  diphenhydrAMINE  (BENADRYL) capsule 25 mg (25 mg Oral Given 12/29/22 0222)  ketorolac (TORADOL) 15 MG/ML injection 15 mg (15 mg Intravenous Given 12/29/22 0222)    ED Course/ Medical Decision Making/ A&P                             Medical Decision Making Amount and/or Complexity of Data Reviewed Labs: ordered.  Risk Prescription drug management.    22 year old female presenting to the emergency department with multiple bites to her lower extremities.  The patient does not recall being bit.  She did tell nursing staff that a cat was recently diagnosed with fleas.  She states that she has had surrounding redness and pain especially about her knees in association with the bites with associated pain.  She endorses warmth.  She endorses itching associated with the bites.  No fevers or chills.  On arrival, the patient was vitally stable.  Presenting after being bit by multiple insects, suspect fleas given the patient's history of having a cat with fleas.  Not septic with normal and reassuring vitals.  Evidence of some developing surrounding cellulitis on around 2 of the bites for which we will treat with a course of antibiotics.  Advised Benadryl for itching.  Toradol provided for pain control.  Overall stable for out patient management and follow-up.  Return precautions provided.   Final Clinical Impression(s) / ED Diagnoses Final diagnoses:  Insect bite of lower leg, unspecified laterality, initial encounter  Cellulitis, unspecified cellulitis site    Rx / DC Orders ED Discharge Orders          Ordered    doxycycline (VIBRAMYCIN) 100 MG capsule  2 times daily        12/29/22 0205              Ernie Avena, MD 12/29/22 [phone]

## 2022-12-29 NOTE — ED Triage Notes (Signed)
Pt reports with multiple marks/bug bites to both legs. Pt does not recall being bit. Pt states that they hurt and feel like bruises.

## 2023-01-01 ENCOUNTER — Telehealth: Payer: Self-pay | Admitting: *Deleted

## 2023-01-01 NOTE — Transitions of Care (Post Inpatient/ED Visit) (Signed)
   01/01/2023  Name: RHENDA OREGON MRN: [id number] DOB: [birth year]  Today's TOC FU Call Status: Today's TOC FU Call Status:: Successful TOC FU Call Competed TOC FU Call Complete Date: 01/01/23  Transition Care Management Follow-up Telephone Call Date of Discharge: 12/29/22 Discharge Facility: [facility]) Type of Discharge: Emergency Department Reason for ED Visit: Other: (insect bite) How have you been since you were released from the hospital?: Better Any questions or concerns?: No  Items Reviewed: Did you receive and understand the discharge instructions provided?: Yes Dietary orders reviewed?: NA Do you have support at home?: Yes People in Home: significant other Name of Support/Comfort Primary Source: SO-Clay  Medications Reviewed Today: Medications Reviewed Today     Reviewed by Heidi Dach, RN (Registered Nurse) on 01/01/23 at 1004  Med List Status: <None>   Medication Order Taking? Sig Documenting Provider Last Dose Status Informant  APRI 0.15-30 MG-MCG tablet [id number] Yes Take 1 tablet by mouth daily. [provider] Taking Active   doxycycline (VIBRAMYCIN) 100 MG capsule [id number] Yes Take 1 capsule (100 mg total) by mouth 2 (two) times daily for 5 days. Ernie Avena, MD Taking Active   FLUoxetine (PROZAC) 20 MG capsule [id number] Yes Take 1 capsule (20 mg total) by mouth daily. Bahraini, Shawn Route Taking Active   hydrOXYzine (ATARAX) 25 MG tablet [id number] Yes Take 1-2 tablets (25-50 mg total) by mouth at bedtime as needed (sleep). [place]raini, Shawn Route Taking Active   lamoTRIgine (LAMICTAL) 25 MG tablet [id number] Yes Take 1 tablet (25 mg total) daily for 2 weeks then increase to 2 tablets (50 mg total) daily Bahraini, Sarah A Taking Active   Vitamin D, Ergocalciferol, (DRISDOL) 1.25 MG (50000 UNIT) CAPS capsule [id number] Yes Take 50,000 Units by mouth every 7 (seven) days. [provider] Taking Active             Home Care and  Equipment/Supplies: Were Home Health Services Ordered?: NA Any new equipment or medical supplies ordered?: NA  Functional Questionnaire: Do you need assistance with bathing/showering or dressing?: No Do you need assistance with meal preparation?: No Do you need assistance with eating?: No Do you have difficulty maintaining continence: No Do you need assistance with getting out of bed/getting out of a chair/moving?: No Do you have difficulty managing or taking your medications?: No  Follow up appointments reviewed: PCP Follow-up appointment confirmed?: NA Specialist Hospital Follow-up appointment confirmed?: NA Do you need transportation to your follow-up appointment?: No Do you understand care options if your condition(s) worsen?: Yes-patient verbalized understanding  SDOH Interventions Today    Flowsheet Row Most Recent Value  SDOH Interventions   Transportation Interventions Intervention Not Indicated       Estanislado Emms RN, BSN [place]  Managed [facility] RN Care Coordinator [phone]

## 2023-01-16 ENCOUNTER — Ambulatory Visit (HOSPITAL_COMMUNITY): Payer: Medicaid Other | Admitting: Licensed Clinical Social Worker
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
## 2023-01-22 ENCOUNTER — Telehealth (INDEPENDENT_AMBULATORY_CARE_PROVIDER_SITE_OTHER): Payer: MEDICAID | Admitting: Psychiatry

## 2023-01-22 ENCOUNTER — Encounter (HOSPITAL_COMMUNITY): Payer: Self-pay | Admitting: Psychiatry

## 2023-01-22 DIAGNOSIS — F319 Bipolar disorder, unspecified: Secondary | ICD-10-CM

## 2023-01-22 DIAGNOSIS — F411 Generalized anxiety disorder: Secondary | ICD-10-CM

## 2023-01-22 DIAGNOSIS — F4310 Post-traumatic stress disorder, unspecified: Secondary | ICD-10-CM | POA: Diagnosis not present

## 2023-01-22 DIAGNOSIS — F39 Unspecified mood [affective] disorder: Secondary | ICD-10-CM

## 2023-01-22 DIAGNOSIS — F410 Panic disorder [episodic paroxysmal anxiety] without agoraphobia: Secondary | ICD-10-CM | POA: Diagnosis not present

## 2023-01-22 DIAGNOSIS — F1290 Cannabis use, unspecified, uncomplicated: Secondary | ICD-10-CM

## 2023-01-22 MED ORDER — FLUOXETINE HCL 20 MG PO CAPS: 20.0000 mg | ORAL_CAPSULE | Freq: Every day | ORAL | 2 refills | Status: DC

## 2023-01-22 MED ORDER — PROPRANOLOL HCL 10 MG PO TABS: 5.0000 mg | ORAL_TABLET | Freq: Every day | ORAL | 1 refills | Status: AC | PRN

## 2023-01-22 MED ORDER — LAMOTRIGINE 100 MG PO TABS: ORAL_TABLET | ORAL | 1 refills | Status: DC

## 2023-01-22 NOTE — Patient Instructions (Signed)
Thank you for attending your appointment today.  -- INCREASE lamotrigine to 50 mg for 2 weeks then to 100 mg daily thereafter -- START propranolol 5-10 mg daily as needed for panic attacks -- Continue other medications as prescribed.  Please do not make any changes to medications without first discussing with your provider. If you are experiencing a psychiatric emergency, please call 911 or present to your nearest emergency department. Additional crisis, medication management, and therapy resources are included below.  [facility]  [address] [place], [place] [address] [phone] WALK-IN URGENT CARE 24/7 FOR ANYONE [address] [place], [place]  [phone] Fax: [phone] [email or web address] *Interpreters available *Accepts all insurance and uninsured for Urgent Care needs *Accepts Medicaid and uninsured for outpatient treatment (below)      ONLY FOR [facility]  Below:    Outpatient New Patient Assessment/Therapy Walk-ins:        Monday -Thursday 8am until slots are full.        Every Friday 1pm-4pm  (first come, first served)                   New Patient Psychiatry/Medication Management        Monday-Friday 8am-11am (first come, first served)               For all walk-ins we ask that you arrive by 7:15am, because patients will be seen in the order of arrival.

## 2023-01-30 ENCOUNTER — Other Ambulatory Visit (HOSPITAL_COMMUNITY): Payer: Self-pay | Admitting: Psychiatry
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
## 2023-03-14 ENCOUNTER — Telehealth (INDEPENDENT_AMBULATORY_CARE_PROVIDER_SITE_OTHER): Payer: MEDICAID | Admitting: Psychiatry

## 2023-03-14 ENCOUNTER — Encounter (HOSPITAL_COMMUNITY): Payer: Self-pay | Admitting: Psychiatry

## 2023-03-14 DIAGNOSIS — F411 Generalized anxiety disorder: Secondary | ICD-10-CM

## 2023-03-14 DIAGNOSIS — F4310 Post-traumatic stress disorder, unspecified: Secondary | ICD-10-CM | POA: Diagnosis not present

## 2023-03-14 DIAGNOSIS — F1010 Alcohol abuse, uncomplicated: Secondary | ICD-10-CM

## 2023-03-14 DIAGNOSIS — F319 Bipolar disorder, unspecified: Secondary | ICD-10-CM

## 2023-03-14 DIAGNOSIS — F410 Panic disorder [episodic paroxysmal anxiety] without agoraphobia: Secondary | ICD-10-CM

## 2023-03-14 DIAGNOSIS — F39 Unspecified mood [affective] disorder: Secondary | ICD-10-CM

## 2023-03-14 DIAGNOSIS — F1290 Cannabis use, unspecified, uncomplicated: Secondary | ICD-10-CM

## 2023-03-14 MED ORDER — CARIPRAZINE HCL 1.5 MG PO CAPS: 1.5000 mg | ORAL_CAPSULE | Freq: Every day | ORAL | 2 refills | Status: AC

## 2023-03-14 MED ORDER — FLUOXETINE HCL 20 MG PO CAPS: 60.0000 mg | ORAL_CAPSULE | Freq: Every day | ORAL | 2 refills | Status: AC

## 2023-03-14 NOTE — Patient Instructions (Addendum)
Thank you for attending your appointment today.  -- Continue Prozac 60 mg daily -- START Vraylar 1.5 mg daily as previously discussed with your PCP -- Continue other medications as prescribed. -- A great resource for therapy is psychologytoday.com where you can filter by therapist specialty, location, and insruance.   Please do not make any changes to medications without first discussing with your provider. If you are experiencing a psychiatric emergency, please call 911 or present to your nearest emergency department. Additional crisis, medication management, and therapy resources are included below.  [facility]  [address] [place], [place] [address] [phone] WALK-IN URGENT CARE 24/7 FOR ANYONE [address] [place], [place]  [phone] Fax: [phone] [email or web address] *Interpreters available *Accepts all insurance and uninsured for Urgent Care needs *Accepts Medicaid and uninsured for outpatient treatment (below)      ONLY FOR [facility]  Below:    Outpatient New Patient Assessment/Therapy Walk-ins:        Monday -Thursday 8am until slots are full.        Every Friday 1pm-4pm  (first come, first served)                   New Patient Psychiatry/Medication Management        Monday-Friday 8am-11am (first come, first served)               For all walk-ins we ask that you arrive by 7:15am, because patients will be seen in the order of arrival.
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
## 2023-04-25 ENCOUNTER — Telehealth (HOSPITAL_COMMUNITY): Payer: MEDICAID | Admitting: Psychiatry

## 2023-04-25 ENCOUNTER — Encounter (HOSPITAL_COMMUNITY): Payer: Self-pay | Admitting: Psychiatry

## 2023-04-25 DIAGNOSIS — F39 Unspecified mood [affective] disorder: Secondary | ICD-10-CM | POA: Diagnosis not present

## 2023-04-25 DIAGNOSIS — Z720 Tobacco use: Secondary | ICD-10-CM

## 2023-04-25 DIAGNOSIS — F4310 Post-traumatic stress disorder, unspecified: Secondary | ICD-10-CM | POA: Diagnosis not present

## 2023-04-25 DIAGNOSIS — F411 Generalized anxiety disorder: Secondary | ICD-10-CM | POA: Diagnosis not present

## 2023-04-25 DIAGNOSIS — F1010 Alcohol abuse, uncomplicated: Secondary | ICD-10-CM

## 2023-04-25 MED ORDER — NICOTINE 21 MG/24HR TD PT24: 21.0000 mg | MEDICATED_PATCH | Freq: Every day | TRANSDERMAL | 3 refills | Status: AC

## 2023-04-25 MED ORDER — NICOTINE POLACRILEX 4 MG MT GUM: 4.0000 mg | CHEWING_GUM | OROMUCOSAL | 3 refills | Status: AC | PRN

## 2023-04-25 NOTE — Patient Instructions (Signed)
Thank you for attending your appointment today.  -- START nicotine patch 21 mg daily + gum as needed -- Continue other medications as prescribed.  Please do not make any changes to medications without first discussing with your provider. If you are experiencing a psychiatric emergency, please call 911 or present to your nearest emergency department. Additional crisis, medication management, and therapy resources are included below.  [facility]  [address] [place], [place] [address] [phone] WALK-IN URGENT CARE 24/7 FOR ANYONE [address] [place], [place]  [phone] Fax: [phone] [email or web address] *Interpreters available *Accepts all insurance and uninsured for Urgent Care needs *Accepts Medicaid and uninsured for outpatient treatment (below)      ONLY FOR [facility]  Below:    Outpatient New Patient Assessment/Therapy Walk-ins:        Monday -Thursday 8am until slots are full.        Every Friday 1pm-4pm  (first come, first served)                   New Patient Psychiatry/Medication Management        Monday-Friday 8am-11am (first come, first served)               For all walk-ins we ask that you arrive by 7:15am, because patients will be seen in the order of arrival.

## 2023-05-04 ENCOUNTER — Ambulatory Visit
Admission: EM | Admit: 2023-05-04 | Discharge: 2023-05-04 | Disposition: A | Discharge status: Home / Self Care | Payer: MEDICAID | Attending: Internal Medicine | Admitting: Internal Medicine

## 2023-05-04 DIAGNOSIS — S20212A Contusion of left front wall of thorax, initial encounter: Secondary | ICD-10-CM | POA: Diagnosis not present

## 2023-05-04 DIAGNOSIS — R1030 Lower abdominal pain, unspecified: Secondary | ICD-10-CM | POA: Diagnosis not present

## 2023-05-04 DIAGNOSIS — S0012XA Contusion of left eyelid and periocular area, initial encounter: Secondary | ICD-10-CM | POA: Diagnosis present

## 2023-05-04 DIAGNOSIS — R319 Hematuria, unspecified: Secondary | ICD-10-CM | POA: Diagnosis present

## 2023-05-04 LAB — POCT URINALYSIS DIP (MANUAL ENTRY)
Bilirubin, UA: NEGATIVE
Glucose, UA: NEGATIVE mg/dL
Leukocytes, UA: NEGATIVE
Nitrite, UA: NEGATIVE
Spec Grav, UA: 1.025 (ref 1.010–1.025)
Urobilinogen, UA: 0.2 U/dL
pH, UA: 6 (ref 5.0–8.0)
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
## 2023-05-06 LAB — URINE CULTURE: Culture: 40000 — AB

## 2023-06-05 NOTE — Progress Notes (Unsigned)
Patient did not connect for virtual psychiatric medication management appointment on 06/06/23 at 10AM. Sent secure video link with no response. Called phone and patient answered stating she is in another doctor's appointment and will need to reschedule appointment with this provider. Rescheduled to 12/20 at 9AM by video.   Daine Gip, MD 06/06/23

## 2023-06-06 ENCOUNTER — Encounter (HOSPITAL_COMMUNITY): Payer: MEDICAID | Admitting: Psychiatry

## 2023-06-06 ENCOUNTER — Encounter (HOSPITAL_COMMUNITY): Payer: Self-pay

## 2023-06-08 ENCOUNTER — Telehealth (HOSPITAL_COMMUNITY): Payer: MEDICAID | Admitting: Psychiatry
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
## 2023-06-22 ENCOUNTER — Encounter (HOSPITAL_COMMUNITY): Payer: Self-pay | Admitting: Psychiatry

## 2023-06-22 ENCOUNTER — Telehealth (HOSPITAL_COMMUNITY): Payer: MEDICAID | Admitting: Psychiatry

## 2023-06-22 DIAGNOSIS — F39 Unspecified mood [affective] disorder: Secondary | ICD-10-CM | POA: Diagnosis not present

## 2023-06-22 DIAGNOSIS — F4310 Post-traumatic stress disorder, unspecified: Secondary | ICD-10-CM

## 2023-06-22 DIAGNOSIS — F1290 Cannabis use, unspecified, uncomplicated: Secondary | ICD-10-CM

## 2023-06-22 DIAGNOSIS — F411 Generalized anxiety disorder: Secondary | ICD-10-CM | POA: Diagnosis not present

## 2023-06-22 NOTE — Patient Instructions (Addendum)
 Thank you for attending your appointment today.  -- We did not make any medication changes today. Please continue medications as prescribed. -- As discussed, we will not schedule a follow up appointment however please call the clinic if your primary care doctor is not comfortable taking over management of your mental health medications. -- A great resource for finding a therapist is [email or web address] where you can search for therapists by speciality, location, and insurance.   Please do not make any changes to medications without first discussing with your provider. If you are experiencing a psychiatric emergency, please call 911 or present to your nearest emergency department. Additional crisis, medication management, and therapy resources are included below.  [facility]  [address], [place], [place] [address] [phone] WALK-IN URGENT CARE 24/7 FOR ANYONE [address], [place], [place]  [phone] Fax: [phone] [email or web address] *Interpreters available *Accepts all insurance and uninsured for Urgent Care needs *Accepts Medicaid and uninsured for outpatient treatment (below)      ONLY FOR [facility]  Below:    Outpatient New Patient Assessment/Therapy Walk-ins:        Monday, Wednesday, and Thursday 8am until slots are full (first come, first served)                   New Patient Psychiatry/Medication Management        Monday-Friday 8am-11am (first come, first served)               For all walk-ins we ask that you arrive by 7:15am, because patients will be seen in the order of arrival.

## 2023-07-18 ENCOUNTER — Ambulatory Visit: Admission: EM | Admit: 2023-07-18 | Discharge: 2023-07-18 | Discharge status: Against Medical Advice | Payer: MEDICAID

## 2023-07-18 NOTE — ED Notes (Signed)
Pt was brought back for triage d/t pt stating she "was going to pass out." Vitals obtained and are stable. Cheri Rous, NP informed of pt's status and vitals. Per provider, pt is informed to wait back in the waiting room until she is next. Pt states "I'm going to go to the ER to get some fluids." Pt leaves after triage and before seeing the provider.

## 2023-07-18 NOTE — ED Triage Notes (Signed)
Pt presents to UC for c/o chills, body aches, back pain, sore throat, coughing, vomiting, no appetite x5 days.  Took amoxicillin this morning and last night Excedrin migraine and tylenol
